# Patient Record
Sex: Male | Born: 1975 | Race: Black or African American | Hispanic: No | Marital: Married | Smoking: Former smoker
Health system: Southern US, Community
[De-identification: ages and names within clinical notes are randomized; demographics above are authoritative.]

## PROBLEM LIST (undated history)

## (undated) DIAGNOSIS — R569 Unspecified convulsions: Secondary | ICD-10-CM

---

## 1998-11-04 ENCOUNTER — Emergency Department (HOSPITAL_COMMUNITY): Admission: EM | Admit: 1998-11-04 | Discharge: 1998-11-04 | Payer: Self-pay | Admitting: Emergency Medicine

## 1998-11-22 ENCOUNTER — Emergency Department (HOSPITAL_COMMUNITY): Admission: EM | Admit: 1998-11-22 | Discharge: 1998-11-22 | Payer: Self-pay

## 1998-12-16 ENCOUNTER — Emergency Department (HOSPITAL_COMMUNITY): Admission: EM | Admit: 1998-12-16 | Discharge: 1998-12-16 | Payer: Self-pay | Admitting: Emergency Medicine

## 1999-05-09 ENCOUNTER — Emergency Department (HOSPITAL_COMMUNITY): Admission: EM | Admit: 1999-05-09 | Discharge: 1999-05-09 | Payer: Self-pay | Admitting: *Deleted

## 2007-08-11 ENCOUNTER — Emergency Department (HOSPITAL_COMMUNITY): Admission: EM | Admit: 2007-08-11 | Discharge: 2007-08-11 | Payer: Self-pay | Admitting: Emergency Medicine

## 2007-09-17 ENCOUNTER — Emergency Department (HOSPITAL_COMMUNITY): Admission: EM | Admit: 2007-09-17 | Discharge: 2007-09-17 | Payer: Self-pay | Admitting: Family Medicine

## 2007-10-20 ENCOUNTER — Emergency Department (HOSPITAL_COMMUNITY): Admission: EM | Admit: 2007-10-20 | Discharge: 2007-10-20 | Payer: Self-pay | Admitting: Emergency Medicine

## 2008-09-09 ENCOUNTER — Emergency Department (HOSPITAL_COMMUNITY): Admission: EM | Admit: 2008-09-09 | Discharge: 2008-09-09 | Payer: Self-pay | Admitting: Family Medicine

## 2008-09-23 ENCOUNTER — Emergency Department (HOSPITAL_COMMUNITY): Admission: EM | Admit: 2008-09-23 | Discharge: 2008-09-23 | Payer: Self-pay | Admitting: Emergency Medicine

## 2008-10-12 ENCOUNTER — Emergency Department (HOSPITAL_COMMUNITY): Admission: EM | Admit: 2008-10-12 | Discharge: 2008-10-13 | Payer: Self-pay | Admitting: Emergency Medicine

## 2008-12-10 ENCOUNTER — Ambulatory Visit (HOSPITAL_COMMUNITY): Admission: EM | Admit: 2008-12-10 | Discharge: 2008-12-10 | Payer: Self-pay | Admitting: Emergency Medicine

## 2009-01-29 ENCOUNTER — Emergency Department (HOSPITAL_COMMUNITY): Admission: EM | Admit: 2009-01-29 | Discharge: 2009-01-29 | Payer: Self-pay | Admitting: Emergency Medicine

## 2009-12-01 ENCOUNTER — Emergency Department (HOSPITAL_COMMUNITY): Admission: EM | Admit: 2009-12-01 | Discharge: 2009-12-02 | Payer: Self-pay | Admitting: Emergency Medicine

## 2010-03-09 ENCOUNTER — Emergency Department (HOSPITAL_COMMUNITY): Admission: EM | Admit: 2010-03-09 | Discharge: 2010-03-09 | Payer: Self-pay | Admitting: Emergency Medicine

## 2010-07-24 ENCOUNTER — Emergency Department (HOSPITAL_COMMUNITY): Admission: EM | Admit: 2010-07-24 | Discharge: 2010-07-24 | Payer: Self-pay | Admitting: Emergency Medicine

## 2010-09-08 ENCOUNTER — Emergency Department (HOSPITAL_COMMUNITY): Admission: EM | Admit: 2010-09-08 | Discharge: 2010-09-09 | Payer: Self-pay | Admitting: Emergency Medicine

## 2010-10-25 ENCOUNTER — Emergency Department (HOSPITAL_COMMUNITY)
Admission: EM | Admit: 2010-10-25 | Discharge: 2010-10-26 | Payer: Self-pay | Source: Home / Self Care | Admitting: Emergency Medicine

## 2010-12-13 ENCOUNTER — Emergency Department (HOSPITAL_COMMUNITY)
Admission: EM | Admit: 2010-12-13 | Discharge: 2010-12-13 | Payer: Self-pay | Source: Home / Self Care | Admitting: Emergency Medicine

## 2011-02-28 LAB — CARBAMAZEPINE LEVEL, TOTAL: Carbamazepine Lvl: 3.4 ug/mL — ABNORMAL LOW (ref 4.0–12.0)

## 2011-03-01 LAB — URINE MICROSCOPIC-ADD ON

## 2011-03-01 LAB — RAPID URINE DRUG SCREEN, HOSP PERFORMED
Amphetamines: NOT DETECTED
Opiates: NOT DETECTED
Tetrahydrocannabinol: POSITIVE — AB

## 2011-03-01 LAB — URINALYSIS, ROUTINE W REFLEX MICROSCOPIC
Leukocytes, UA: NEGATIVE
Nitrite: NEGATIVE
Protein, ur: 100 mg/dL — AB
Specific Gravity, Urine: 1.015 (ref 1.005–1.030)
Urobilinogen, UA: 0.2 mg/dL (ref 0.0–1.0)

## 2011-03-01 LAB — BLOOD GAS, ARTERIAL
Drawn by: 331761
FIO2: 0.32 %
pCO2 arterial: 47.8 mmHg — ABNORMAL HIGH (ref 35.0–45.0)
pH, Arterial: 7.359 (ref 7.350–7.450)

## 2011-03-01 LAB — DIFFERENTIAL
Lymphs Abs: 4.1 10*3/uL — ABNORMAL HIGH (ref 0.7–4.0)
Monocytes Relative: 9 % (ref 3–12)
Neutro Abs: 4.6 10*3/uL (ref 1.7–7.7)
Neutrophils Relative %: 47 % (ref 43–77)

## 2011-03-01 LAB — CBC
HCT: 45.3 % (ref 39.0–52.0)
MCHC: 32.8 g/dL (ref 30.0–36.0)
Platelets: 242 10*3/uL (ref 150–400)
RDW: 14.8 % (ref 11.5–15.5)

## 2011-03-01 LAB — COMPREHENSIVE METABOLIC PANEL
ALT: 21 U/L (ref 0–53)
Albumin: 4.4 g/dL (ref 3.5–5.2)
BUN: 10 mg/dL (ref 6–23)
Calcium: 10.1 mg/dL (ref 8.4–10.5)
Glucose, Bld: 134 mg/dL — ABNORMAL HIGH (ref 70–99)
Sodium: 144 mEq/L (ref 135–145)
Total Protein: 7.8 g/dL (ref 6.0–8.3)

## 2011-03-01 LAB — GLUCOSE, CAPILLARY: Glucose-Capillary: 134 mg/dL — ABNORMAL HIGH (ref 70–99)

## 2011-03-01 LAB — ETHANOL: Alcohol, Ethyl (B): <5 mg/dL (ref 0–10)

## 2011-03-01 LAB — LACTIC ACID, PLASMA: Lactic Acid, Venous: 2.1 mmol/L (ref 0.5–2.2)

## 2011-03-03 LAB — POCT I-STAT, CHEM 8
Calcium, Ion: 1.17 mmol/L (ref 1.12–1.32)
Chloride: 103 mEq/L (ref 96–112)
Glucose, Bld: 79 mg/dL (ref 70–99)
HCT: 40 % (ref 39.0–52.0)
Hemoglobin: 13.6 g/dL (ref 13.0–17.0)

## 2011-03-03 LAB — GLUCOSE, CAPILLARY: Glucose-Capillary: 79 mg/dL (ref 70–99)

## 2011-03-04 LAB — BASIC METABOLIC PANEL
BUN: 14 mg/dL (ref 6–23)
GFR calc non Af Amer: >60 mL/min (ref >60)
Glucose, Bld: 89 mg/dL (ref 70–99)
Potassium: 4.8 mEq/L (ref 3.5–5.1)

## 2011-03-14 LAB — BASIC METABOLIC PANEL
BUN: 12 mg/dL (ref 6–23)
GFR calc non Af Amer: >60 mL/min (ref >60)
Glucose, Bld: 127 mg/dL — ABNORMAL HIGH (ref 70–99)
Potassium: 4.4 mEq/L (ref 3.5–5.1)

## 2011-04-05 LAB — DIFFERENTIAL
Basophils Absolute: 0 10*3/uL (ref 0.0–0.1)
Eosinophils Absolute: 0.1 10*3/uL (ref 0.0–0.7)
Eosinophils Relative: 1 % (ref 0–5)
Lymphocytes Relative: 23 % (ref 12–46)
Lymphs Abs: 3.2 10*3/uL (ref 0.7–4.0)
Monocytes Absolute: 0.7 10*3/uL (ref 0.1–1.0)

## 2011-04-05 LAB — CARBAMAZEPINE LEVEL, TOTAL: Carbamazepine Lvl: 4.6 ug/mL (ref 4.0–12.0)

## 2011-04-05 LAB — RAPID URINE DRUG SCREEN, HOSP PERFORMED
Amphetamines: NOT DETECTED
Benzodiazepines: POSITIVE — AB

## 2011-04-05 LAB — CBC
HCT: 46.4 % (ref 39.0–52.0)
Hemoglobin: 15.1 g/dL (ref 13.0–17.0)
MCV: 92.5 fL (ref 78.0–100.0)
Platelets: 267 10*3/uL (ref 150–400)
RDW: 15.4 % (ref 11.5–15.5)

## 2011-04-05 LAB — BASIC METABOLIC PANEL
BUN: 9 mg/dL (ref 6–23)
Chloride: 104 mEq/L (ref 96–112)
GFR calc non Af Amer: >60 mL/min (ref >60)
Glucose, Bld: 164 mg/dL — ABNORMAL HIGH (ref 70–99)
Potassium: 4.4 mEq/L (ref 3.5–5.1)
Sodium: 139 mEq/L (ref 135–145)

## 2011-05-03 NOTE — Op Note (Signed)
Mark Trevino, Mark Trevino          ACCOUNT NO.:  [id number]   MEDICAL RECORD NO.:  [id number]          PATIENT TYPE:  OBV   LOCATION:  2550                         FACILITY:  [facility]   PHYSICIAN:  Kinnie Scales. Annalee Genta, M.D.DATE OF BIRTH:  [birth year]   DATE OF PROCEDURE:  DATE OF DISCHARGE:  12/10/2008                               OPERATIVE REPORT   LOCATION:  [facility], main OR.   PREOPERATIVE DIAGNOSES:  1. Esophageal foreign body.  2. Seizure disorder.   POSTOPERATIVE DIAGNOSES:  1. Esophageal foreign body.  2. Seizure disorder.   SURGICAL PROCEDURES:  Direct laryngoscopy and esophagoscopy with removal  of esophageal foreign body under gentle anesthesia.   SURGEON:  Kinnie Scales. Annalee Genta, MD   ANESTHESIA:  General endotracheal.   COMPLICATIONS:  None.   BLOOD LOSS:  Minimal.  The patient transferred from the operating room  to recovery in stable condition.   FINDINGS:  The patient had a large foreign body in the esophageal  introitus, this was his partial denture prosthesis.  On removal of the  prosthesis, there was a small ulcerated area on the posterior aspect of  the esophageal introitus.  No evidence of perforation or infection.   BRIEF HISTORY:  The patient is a 35 year old black male with history of  seizure disorder.  The patient has been taking his medications.  He  suffered a seizure on the morning of admission and was brought to the  [facility] Emergency Department.  In the ER, the patient  complained of severe sore throat and difficulty swallowing fluids,  solids, and his own secretions.  Flexible endoscopy performed in the  emergency room showed purulent secretions in the posterior hypopharynx  and possible esophageal foreign body.  Given his history and physical  findings, I recommend he undertake direct laryngoscopy, esophagoscopy,  and removal of esophageal foreign body.  The risks, benefits, and  possible complications were discussed with  the patient who understood  and concurred plan for surgery.   PROCEDURE:  The patient was brought to the operating room on December 09, 2008, to the [facility] Main OR on an emergency basis for  evaluation of an esophageal foreign body.  The patient was placed under  general anesthesia and intubated.  Direct laryngoscopy was performed.  Small amount of bloody secretions which were suctioned.  The patient was  found that the patient's foreign body had migrated proximally and was  removed from the hypopharynx without difficulty.  The remainder of the  hypopharynx was examined, no evidence of lesion or ulcer, cervical  esophagoscopy was then performed.  The cervical esophagoscope was  inserted full-length to 20 cm and then gradually withdrawn.  There was  no evidence of additional foreign body or abnormality with the exception  of small ulcerated area on the posterior aspect of the esophageal  introitus.  No evidence of perforation, bleeding, or infection.  The  patient's oral cavity was irrigated and suctioned.  He was then awakened  from his anesthetic, extubated, and transferred from the operating room  to recovery room in stable condition.  No complications.  Blood loss,  minimal.           ______________________________  Kinnie Scales. Annalee Genta, M.D.     DLS/MEDQ  D:  16/09/9603  T:  12/11/2008  Job:  [id number]

## 2011-09-20 LAB — URINALYSIS, ROUTINE W REFLEX MICROSCOPIC
Glucose, UA: NEGATIVE
Leukocytes, UA: NEGATIVE
Protein, ur: 100 — AB
Specific Gravity, Urine: 1.028
pH: 5.5

## 2011-09-20 LAB — BASIC METABOLIC PANEL
Calcium: 9.2
Creatinine, Ser: 1.06
GFR calc Af Amer: >60
Sodium: 135

## 2011-09-20 LAB — URINE MICROSCOPIC-ADD ON

## 2011-09-20 LAB — DIFFERENTIAL
Lymphocytes Relative: 12
Lymphs Abs: 1
Monocytes Relative: 4
Neutro Abs: 6.9
Neutrophils Relative %: 84 — ABNORMAL HIGH

## 2011-09-20 LAB — CBC
Hemoglobin: 13.9
RBC: 4.64
WBC: 8.2

## 2011-09-23 LAB — RAPID STREP SCREEN (MED CTR MEBANE ONLY): Streptococcus, Group A Screen (Direct): NEGATIVE

## 2019-11-11 ENCOUNTER — Emergency Department (HOSPITAL_COMMUNITY)
Admission: EM | Admit: 2019-11-11 | Discharge: 2019-11-12 | Disposition: A | Discharge status: Home / Self Care | Payer: HRSA Program | Attending: Emergency Medicine | Admitting: Emergency Medicine

## 2019-11-11 ENCOUNTER — Emergency Department (HOSPITAL_COMMUNITY): Payer: HRSA Program

## 2019-11-11 ENCOUNTER — Other Ambulatory Visit: Payer: Self-pay

## 2019-11-11 ENCOUNTER — Encounter (HOSPITAL_COMMUNITY): Payer: Self-pay | Admitting: *Deleted

## 2019-11-11 DIAGNOSIS — U071 COVID-19: Secondary | ICD-10-CM | POA: Diagnosis not present

## 2019-11-11 DIAGNOSIS — R0602 Shortness of breath: Secondary | ICD-10-CM | POA: Diagnosis present

## 2019-11-11 HISTORY — DX: Unspecified convulsions: R56.9

## 2019-11-11 LAB — I-STAT CHEM 8, ED
BUN: 12 mg/dL (ref 6–20)
Calcium, Ion: 1 mmol/L — ABNORMAL LOW (ref 1.15–1.40)
Chloride: 92 mmol/L — ABNORMAL LOW (ref 98–111)
Creatinine, Ser: 1.1 mg/dL (ref 0.61–1.24)
Glucose, Bld: 112 mg/dL — ABNORMAL HIGH (ref 70–99)
HCT: 40 % (ref 39.0–52.0)
Hemoglobin: 13.6 g/dL (ref 13.0–17.0)
Potassium: 3.9 mmol/L (ref 3.5–5.1)
Sodium: 129 mmol/L — ABNORMAL LOW (ref 135–145)
TCO2: 30 mmol/L (ref 22–32)

## 2019-11-11 MED ORDER — LACTATED RINGERS IV BOLUS
1000.0000 mL | Freq: Once | INTRAVENOUS | Status: AC
  Administered 2019-11-11: 1000 mL via INTRAVENOUS

## 2019-11-11 MED ORDER — ALBUTEROL SULFATE HFA 108 (90 BASE) MCG/ACT IN AERS
6.0000 | INHALATION_SPRAY | Freq: Once | RESPIRATORY_TRACT | Status: AC
  Administered 2019-11-11: 6 via RESPIRATORY_TRACT
  Filled 2019-11-11: qty 6.7

## 2019-11-11 MED ORDER — ACETAMINOPHEN 325 MG PO TABS: 650.0000 mg | ORAL_TABLET | Freq: Once | ORAL | Status: DC | PRN

## 2019-11-11 MED ORDER — KETOROLAC TROMETHAMINE 30 MG/ML IJ SOLN
30.0000 mg | Freq: Once | INTRAMUSCULAR | Status: AC
  Administered 2019-11-11: 30 mg via INTRAVENOUS
  Filled 2019-11-11: qty 1

## 2019-11-11 NOTE — ED Triage Notes (Signed)
Pt says that he tested positive for COVID on Saturday. Said he has had cough, SOB, sweaty, fatigue. tmax 102 Sunday. No acute distress.

## 2019-11-11 NOTE — ED Provider Notes (Signed)
[facility] EMERGENCY DEPARTMENT Provider Note   CSN: [id number] Arrival date & time: 11/11/19  2100     History   Chief Complaint No chief complaint on file.   HPI KHARTER BREW is a 43 y.o. male.     Diagnosed with covid a few days ago and has arthralgias, myalgias, fever, cough, sore throat, headache decreased p.o. intake for solids, decreased appetite and loss of taste.  No significant breathing difficulties. Has taken aleve at home which helps some but fades quickly.      Past Medical History:  Diagnosis Date  . Seizures (HCC)     There are no active problems to display for this patient.   History reviewed. No pertinent surgical history.      Home Medications    Prior to Admission medications   Not on File    Family History No family history on file.  Social History Social History   Tobacco Use  . Smoking status: Never Smoker  Substance Use Topics  . Alcohol use: Not Currently    Frequency: Never  . Drug use: Not Currently     Allergies   Patient has no known allergies.   Review of Systems Review of Systems  All other systems reviewed and are negative.    Physical Exam Updated Vital Signs BP 119/74   Pulse 92   Temp (!) 102.6 F (39.2 C) (Oral)   Resp (!) 21   SpO2 94%   Physical Exam Vitals signs and nursing note reviewed.  Constitutional:      Appearance: He is well-developed.  HENT:     Head: Normocephalic and atraumatic.     Mouth/Throat:     Mouth: Mucous membranes are dry.     Pharynx: Oropharynx is clear.  Neck:     Musculoskeletal: Normal range of motion.  Cardiovascular:     Rate and Rhythm: Normal rate.  Pulmonary:     Effort: Pulmonary effort is normal. No respiratory distress.  Abdominal:     General: Abdomen is flat. There is no distension.  Musculoskeletal: Normal range of motion.  Skin:    General: Skin is warm and dry.     Coloration: Skin is not jaundiced.  Neurological:     General: No focal deficit present.     Mental Status: He is alert.      ED Treatments / Results  Labs (all labs ordered are listed, but only abnormal results are displayed) Labs Reviewed  I-STAT CHEM 8, ED - Abnormal; Notable for the following components:      Result Value   Sodium 129 (*)    Chloride 92 (*)    Glucose, Bld 112 (*)    Calcium, Ion 1.00 (*)    All other components within normal limits    EKG None  Radiology Dg Chest Portable 1 View  Result Date: 11/11/2019 CLINICAL DATA:  Cough and short of breath, COVID positive EXAM: PORTABLE CHEST 1 VIEW COMPARISON:  01/29/2009 FINDINGS: Streaky perihilar opacity with patchy airspace disease at the bases. Normal heart size. No pneumothorax. Possible metallic foreign body over the left mid chest. IMPRESSION: Streaky perihilar opacity with patchy airspace disease the bases, consistent with pneumonia, and given clinical history. Electronically Signed   By: Jasmine Pang M.D.   On: 11/11/2019 22:58    Procedures Procedures (including critical care time)  Medications Ordered in ED Medications  acetaminophen (TYLENOL) tablet 650 mg (has no administration in time range)  lactated ringers bolus  1,000 mL (1,000 mLs Intravenous New Bag/Given 11/11/19 2222)  albuterol (VENTOLIN HFA) 108 (90 Base) MCG/ACT inhaler 6 puff (6 puffs Inhalation Given 11/11/19 2222)  ketorolac (TORADOL) 30 MG/ML injection 30 mg (30 mg Intravenous Given 11/11/19 2223)     Initial Impression / Assessment and Plan / ED Course  I have reviewed the triage vital signs and the nursing notes.  Pertinent labs & imaging results that were available during my care of the patient were reviewed by me and considered in my medical decision making (see chart for details).  Overall patient appears well.  He is not tachypneic, hypoxic, tachycardic no evidence of respiratory distress.  Patient is tolerating p.o. without difficulty.  And is work-up is unremarkable.   Discussed reasons for return to the emergency department but this time I do feel patient needs admission or further work-up however he is only on day 3 of illness so is likely to still get worse.  Final Clinical Impressions(s) / ED Diagnoses   Final diagnoses:  [id number]    ED Discharge Orders    None       Lamarco Gudiel, Corene Cornea, MD 11/11/19 2329

## 2019-11-12 NOTE — ED Notes (Signed)
Discharge instructions reviewed with pt. Pt verbalized understanding.   

## 2019-11-21 ENCOUNTER — Encounter: Payer: Self-pay | Admitting: Family Medicine

## 2019-11-21 ENCOUNTER — Telehealth (INDEPENDENT_AMBULATORY_CARE_PROVIDER_SITE_OTHER): Payer: No Typology Code available for payment source | Admitting: Family Medicine

## 2019-11-21 DIAGNOSIS — Z0000 Encounter for general adult medical examination without abnormal findings: Secondary | ICD-10-CM | POA: Diagnosis not present

## 2019-11-21 DIAGNOSIS — Z5181 Encounter for therapeutic drug level monitoring: Secondary | ICD-10-CM

## 2019-11-21 DIAGNOSIS — G40409 Other generalized epilepsy and epileptic syndromes, not intractable, without status epilepticus: Secondary | ICD-10-CM

## 2019-11-21 MED ORDER — CARBAMAZEPINE 200 MG PO TABS: ORAL_TABLET | ORAL | 0 refills | Status: DC

## 2019-11-21 MED ORDER — LAMOTRIGINE 200 MG PO TABS: 200.0000 mg | ORAL_TABLET | Freq: Two times a day (BID) | ORAL | 0 refills | Status: DC

## 2019-11-21 NOTE — Progress Notes (Signed)
Established Patient Office Visit  Subjective:  Patient ID: Mark Trevino, male    DOB: 1976-11-26  Age: 43 y.o. MRN: [id number]  CC:  Chief Complaint  Patient presents with  . Establish Care    HPI Mark Trevino presents for establishment of care and follow-up for his seizure disorder.  Carries a diagnosis of grand mal seizure disorder since age 38.  Historically well controlled with Tegretol and Lamictal.  Moved into this area back in June.  Is neurologist from [place] called in the emergency supply and instructed him to find a physician.  Patient has not had follow-up with a neurologist in quite some time.  He did experience a seizure back in June when he was given prednisone for a work-related injury.  He is here in [place] with his wife and 64 and 69 year old daughters.  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  He is on light duty from a work-related arm injury.  He does not smoke drink alcohol or use illicit drugs.  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  Parents are deceased.  Mom had diabetes.  Dad had dementia and cancer.  Patient believes his dad had prostate cancer.  Patient developed symptoms of Covid back on the 20th.  Seen at [facility] on the 23rd.  Is recovering from that illness.  His Covid test was negative he tells me.  Past Medical History:  Diagnosis Date  . Seizures ([place])     History reviewed. No pertinent surgical history.  History reviewed. No pertinent family history.  Social History   Socioeconomic History  . Marital status: Married    Spouse name: Not on file  . Number of children: Not on file  . Years of education: Not on file  . Highest education level: Not on file  Occupational History  . Not on file  Social Needs  . Financial resource strain: Not on file  . Food insecurity    Worry: Not on file    Inability: Not on file  . Transportation needs    Medical: Not on file    Non-medical: Not  on file  Tobacco Use  . Smoking status: Never Smoker  . Smokeless tobacco: Never Used  Substance and Sexual Activity  . Alcohol use: Not Currently    Frequency: Never  . Drug use: Not Currently  . Sexual activity: Not on file  Lifestyle  . Physical activity    Days per week: Not on file    Minutes per session: Not on file  . Stress: Not on file  Relationships  . Social Herbalist on phone: Not on file    Gets together: Not on file    Attends religious service: Not on file    Active member of club or organization: Not on file    Attends meetings of clubs or organizations: Not on file    Relationship status: Not on file  . Intimate partner violence    Fear of current or ex partner: Not on file    Emotionally abused: Not on file    Physically abused: Not on file    Forced sexual activity: Not on file  Other Topics Concern  . Not on file  Social History Narrative  . Not on file    Outpatient Medications Prior to Visit  Medication Sig Dispense Refill  . carbamazepine (TEGRETOL) 200 MG tablet Take 200 mg by mouth 3 (three) times daily.    Marland Kitchen  carbamazepine (TEGRETOL) 200 MG tablet Take 400 mg by mouth 3 (three) times daily.    Marland Kitchen lamoTRIgine (LAMICTAL) 200 MG tablet Take 200 mg by mouth 2 (two) times daily.     No facility-administered medications prior to visit.     No Known Allergies  ROS Review of Systems  Constitutional: Negative for diaphoresis, fatigue, fever and unexpected weight change.  HENT: Negative.   Eyes: Negative for photophobia and visual disturbance.  Respiratory: Negative.   Cardiovascular: Negative.   Gastrointestinal: Negative.   Endocrine: Negative for polyphagia and polyuria.  Genitourinary: Negative.   Musculoskeletal: Negative.   Allergic/Immunologic: Negative for immunocompromised state.  Neurological: Positive for seizures. Negative for speech difficulty.  Hematological: Negative.   Psychiatric/Behavioral: Negative.        Objective:    Physical Exam  Constitutional: He is oriented to person, place, and time. He appears well-developed and well-nourished. No distress.  HENT:  Head: Normocephalic and atraumatic.  Right Ear: External ear normal.  Left Ear: External ear normal.  Eyes: Conjunctivae are normal. Right eye exhibits no discharge. Left eye exhibits no discharge. No scleral icterus.  Neck: No JVD present. No tracheal deviation present.  Pulmonary/Chest: Effort normal. No stridor.  Neurological: He is alert and oriented to person, place, and time.  Skin: He is not diaphoretic.  Psychiatric: He has a normal mood and affect. His behavior is normal.    There were no vitals taken for this visit. Wt Readings from Last 3 Encounters:  No data found for Wt   BP Readings from Last 3 Encounters:  11/12/19 113/76   Guideline developer:  UpToDate (see UpToDate for funding source) Date Released: June 2014  Health Maintenance Due  Topic Date Due  . HIV Screening  04/13/1991  . TETANUS/TDAP  04/13/1995  . INFLUENZA VACCINE  07/20/2019    There are no preventive care reminders to display for this patient.  No results found for: TSH Lab Results  Component Value Date   WBC 9.7 10/25/2010   HGB 13.6 11/11/2019   HCT 40.0 11/11/2019   MCV 94.3 10/25/2010   PLT 242 10/25/2010   Lab Results  Component Value Date   NA 129 (L) 11/11/2019   K 3.9 11/11/2019   CO2 (LL) 10/25/2010    8 RESULT REPEATED AND VERIFIED CRITICAL RESULT CALLED TO, READ BACK BY AND VERIFIED WITH: HENDERSON,C. AT 1949 ON [id number] BY LOVE,T.   GLUCOSE 112 (H) 11/11/2019   BUN 12 11/11/2019   CREATININE 1.10 11/11/2019   BILITOT 0.3 10/25/2010   ALKPHOS 74 10/25/2010   AST 40 (H) 10/25/2010   ALT 21 10/25/2010   PROT 7.8 10/25/2010   ALBUMIN 4.4 10/25/2010   CALCIUM 10.1 10/25/2010   No results found for: CHOL No results found for: HDL No results found for: LDLCALC No results found for: TRIG No results found for: CHOLHDL  No results found for: HGBA1C    Assessment & Plan:   Problem List Items Addressed This Visit      Nervous and Auditory   Grand mal seizure disorder ([place])   Relevant Medications   carbamazepine (TEGRETOL) 200 MG tablet   lamoTRIgine (LAMICTAL) 200 MG tablet   Other Relevant Orders   Ambulatory referral to Neurology     Other   Healthcare maintenance - Primary   Relevant Orders   CBC   Comp Met (CMET)   Lipid Profile   Urinalysis, Routine w reflex microscopic   TSH   HIV antibody (with reflex)  Flu Vaccine QUAD 6+ mos PF IM (Fluarix Quad PF)   PSA    Other Visit Diagnoses    Medication monitoring encounter       Relevant Orders   CBC   Comp Met (CMET)   Carbamazepine Level (Tegretol), total   Lamotrigine level      Meds ordered this encounter  Medications  . carbamazepine (TEGRETOL) 200 MG tablet    Sig: Take 3 tablets twice daily.    Dispense:  540 tablet    Refill:  0  . lamoTRIgine (LAMICTAL) 200 MG tablet    Sig: Take 1 tablet (200 mg total) by mouth 2 (two) times daily.    Dispense:  180 tablet    Refill:  0    Follow-up: Return come to clinic fasting in one week for ordered blood work and to see me for physical within 3 months.  Patient assures me that he will go for his to be scheduled appointment with a neurologist.  He understands that we will be following his seizure disorder at this clinic.  Virtual Visit via Video Note  I connected with Mark Trevino on 11/21/19 at  2:30 PM EST by a video enabled telemedicine application and verified that I am speaking with the correct person using two identifiers.  Location: Patient: home with wife. Provider:    I discussed the limitations of evaluation and management by telemedicine and the availability of in person appointments. The patient expressed understanding and agreed to proceed.  History of Present Illness:    Observations/Objective:   Assessment and Plan:   Follow Up Instructions:     I discussed the assessment and treatment plan with the patient. The patient was provided an opportunity to ask questions and all were answered. The patient agreed with the plan and demonstrated an understanding of the instructions.   The patient was advised to call back or seek an in-person evaluation if the symptoms worsen or if the condition fails to improve as anticipated.  I provided 30 minutes of non-face-to-face time during this encounter.   Libby Maw, MD

## 2019-11-26 ENCOUNTER — Encounter: Payer: Self-pay | Admitting: Neurology

## 2019-12-17 ENCOUNTER — Other Ambulatory Visit: Payer: Self-pay | Admitting: Family Medicine

## 2019-12-17 DIAGNOSIS — Z0000 Encounter for general adult medical examination without abnormal findings: Secondary | ICD-10-CM

## 2019-12-18 ENCOUNTER — Other Ambulatory Visit: Payer: Self-pay | Admitting: Family Medicine

## 2019-12-18 ENCOUNTER — Other Ambulatory Visit: Payer: Self-pay

## 2019-12-18 ENCOUNTER — Other Ambulatory Visit (INDEPENDENT_AMBULATORY_CARE_PROVIDER_SITE_OTHER): Payer: No Typology Code available for payment source

## 2019-12-18 DIAGNOSIS — G40409 Other generalized epilepsy and epileptic syndromes, not intractable, without status epilepticus: Secondary | ICD-10-CM

## 2019-12-18 DIAGNOSIS — Z0000 Encounter for general adult medical examination without abnormal findings: Secondary | ICD-10-CM

## 2019-12-18 DIAGNOSIS — Z5181 Encounter for therapeutic drug level monitoring: Secondary | ICD-10-CM | POA: Diagnosis not present

## 2019-12-18 LAB — URINALYSIS, ROUTINE W REFLEX MICROSCOPIC
Bilirubin Urine: NEGATIVE
Hgb urine dipstick: NEGATIVE
Ketones, ur: NEGATIVE
Leukocytes,Ua: NEGATIVE
Nitrite: NEGATIVE
RBC / HPF: NONE SEEN (ref 0–?)
Specific Gravity, Urine: >=1.03 — AB (ref 1.000–1.030)
Total Protein, Urine: NEGATIVE
Urine Glucose: NEGATIVE
Urobilinogen, UA: 0.2 (ref 0.0–1.0)
pH: 5.5 (ref 5.0–8.0)

## 2019-12-18 LAB — LIPID PANEL
Cholesterol: 267 mg/dL — ABNORMAL HIGH (ref 0–200)
HDL: 58 mg/dL (ref >39.00)
LDL Cholesterol: 189 mg/dL — ABNORMAL HIGH (ref 0–99)
NonHDL: 209.04
Total CHOL/HDL Ratio: 5
Triglycerides: 99 mg/dL (ref 0.0–149.0)
VLDL: 19.8 mg/dL (ref 0.0–40.0)

## 2019-12-18 LAB — COMPREHENSIVE METABOLIC PANEL
ALT: 15 U/L (ref 0–53)
AST: 17 U/L (ref 0–37)
Albumin: 4.6 g/dL (ref 3.5–5.2)
Alkaline Phosphatase: 56 U/L (ref 39–117)
BUN: 11 mg/dL (ref 6–23)
CO2: 31 mEq/L (ref 19–32)
Calcium: 9.5 mg/dL (ref 8.4–10.5)
Chloride: 102 mEq/L (ref 96–112)
Creatinine, Ser: 0.98 mg/dL (ref 0.40–1.50)
GFR: 100.69 mL/min (ref >60.00)
Glucose, Bld: 109 mg/dL — ABNORMAL HIGH (ref 70–99)
Potassium: 4.3 mEq/L (ref 3.5–5.1)
Sodium: 140 mEq/L (ref 135–145)
Total Bilirubin: 0.4 mg/dL (ref 0.2–1.2)
Total Protein: 7.3 g/dL (ref 6.0–8.3)

## 2019-12-18 LAB — CBC
HCT: 39.5 % (ref 39.0–52.0)
Hemoglobin: 12.9 g/dL — ABNORMAL LOW (ref 13.0–17.0)
MCHC: 32.7 g/dL (ref 30.0–36.0)
MCV: 87.9 fl (ref 78.0–100.0)
Platelets: 216 10*3/uL (ref 150.0–400.0)
RBC: 4.5 Mil/uL (ref 4.22–5.81)
RDW: 16.4 % — ABNORMAL HIGH (ref 11.5–15.5)
WBC: 2.9 10*3/uL — ABNORMAL LOW (ref 4.0–10.5)

## 2019-12-18 LAB — TSH: TSH: 1.6 u[IU]/mL (ref 0.35–4.50)

## 2019-12-18 LAB — PSA: PSA: 0.38 ng/mL (ref 0.10–4.00)

## 2019-12-19 ENCOUNTER — Encounter: Payer: Self-pay | Admitting: Family Medicine

## 2019-12-21 LAB — CARBAMAZEPINE LEVEL, TOTAL: Carbamazepine Lvl: 11 mg/L (ref 4.0–12.0)

## 2019-12-21 LAB — LAMOTRIGINE LEVEL: Lamotrigine Lvl: 5.1 ug/mL (ref 4.0–18.0)

## 2019-12-21 LAB — HIV ANTIBODY (ROUTINE TESTING W REFLEX): HIV 1&2 Ab, 4th Generation: NONREACTIVE

## 2020-01-19 ENCOUNTER — Other Ambulatory Visit: Payer: Self-pay | Admitting: Family Medicine

## 2020-01-19 DIAGNOSIS — G40409 Other generalized epilepsy and epileptic syndromes, not intractable, without status epilepticus: Secondary | ICD-10-CM

## 2020-01-20 NOTE — Telephone Encounter (Signed)
Pt has an upcoming appt with Neuro

## 2020-01-29 ENCOUNTER — Other Ambulatory Visit: Payer: Self-pay

## 2020-01-29 ENCOUNTER — Encounter: Payer: Self-pay | Admitting: Neurology

## 2020-01-29 ENCOUNTER — Ambulatory Visit (INDEPENDENT_AMBULATORY_CARE_PROVIDER_SITE_OTHER): Payer: No Typology Code available for payment source | Admitting: Neurology

## 2020-01-29 VITALS — BP 122/78 | HR 75 | Ht 77.0 in | Wt 245.0 lb

## 2020-01-29 DIAGNOSIS — G40009 Localization-related (focal) (partial) idiopathic epilepsy and epileptic syndromes with seizures of localized onset, not intractable, without status epilepticus: Secondary | ICD-10-CM | POA: Diagnosis not present

## 2020-01-29 MED ORDER — CARBAMAZEPINE 200 MG PO TABS: ORAL_TABLET | ORAL | 3 refills | Status: AC

## 2020-01-29 MED ORDER — LAMOTRIGINE 200 MG PO TABS: 200.0000 mg | ORAL_TABLET | Freq: Two times a day (BID) | ORAL | 3 refills | Status: AC

## 2020-01-29 NOTE — Patient Instructions (Addendum)
1. Schedule 1-hour EEG  2. Continue Tegretol 200mg  take 3 tabs twice a day and Lamictal 200mg  1 tab twice a day  3. Follow-up in 6 months, call for any changes  Seizure Precautions: 1. If medication has been prescribed for you to prevent seizures, take it exactly as directed.  Do not stop taking the medicine without talking to your doctor first, even if you have not had a seizure in a long time.   2. Avoid activities in which a seizure would cause danger to yourself or to others.  Don't operate dangerous machinery, swim alone, or climb in high or dangerous places, such as on ladders, roofs, or girders.  Do not drive unless your doctor says you may.  3. If you have any warning that you may have a seizure, lay down in a safe place where you can't hurt yourself.    4.  No driving for 6 months from last seizure, as per [facility].   Please refer to the following link on the [employer]ile[employer] of [employer]'s website for more information: http://www.epilepsyfoundation.org/answerplace/Social/driving/drivingu.cfm   5.  Maintain good sleep hygiene.  6.  Contact your doctor if you have any problems that may be related to the medicine you are taking.  7.  Call 911 and bring the patient back to the ED if:        A.  The seizure lasts longer than 5 minutes.       B.  The patient doesn't awaken shortly after the seizure  C.  The patient has new problems such as difficulty seeing, speaking or moving  D.  The patient was injured during the seizure  E.  The patient has a temperature over 102 F (39C)  F.  The patient vomited and now is having trouble breathing

## 2020-01-29 NOTE — Progress Notes (Signed)
NEUROLOGY CONSULTATION NOTE  Mark Trevino MRN: [id number] DOB: [birth year]  Referring provider: Dr. Abelino Derrick Primary care provider: Dr. Abelino Derrick  Reason for consult:  seizures  Dear Dr Ethelene Hal:  Thank you for your kind referral of Mark Trevino for consultation of the above symptoms. Although his history is well known to you, please allow me to reiterate it for the purpose of our medical record. He is alone in the office today. Records and images were personally reviewed where available.   HISTORY OF PRESENT ILLNESS: This is a 44 year old right-handed man with a history of seizures since age 22 presenting to establish care. He reports generalized convulsions that are preceded by a feeling in his stomach coming up his neck. His wife would tell him he would be staring off before convulsing. He also has nocturnal convulsions. He used to had olfactory hallucinations and see his fingers start to shiver a little when younger. When he has his warning symptoms, he knows he has to lie down. He has bitten his tongue with the seizures. No focal weakness, both legs feel weaker after. He has been married to his wife for 3 years and she has witnessed mostly nocturnal seizures, he had a seizure in June 2020 reportedly when he was given prednisone for a work-related injury. Last nocturnal seizure was a month ago, he recalls feeling more tired that day. Last seizure in wakefulness in October 2020. He denies any isolated staring spells. Sometimes when he forgets his evening medication, he can feel strange but it does not progress. If he gets overheated from working too much, it gets to a point where he has to rest and has "just a little shake" and goes into a deep sweat. The deep sweats started later on. He recalls being admitted to the EMU at [facility] and [facility] in [place] in 2005 for 5 days where they "made me have seizures and told there is nothing wrong with you." He was  discharged home on the same medications, he has been on Tegretol 200mg  2 tabs BID since age 36, then after his EMU stay was started on Lamotrigine 200mg  BID. No side effects on medications.   He denies any headaches, dizziness, diplopia, dysarthria/dysphagia,bowel/bladder dysfunction. 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 He continues to have pain in his left chest, back around his shoulder blade, with numbness in his fingers. He now does light duty washing dishes. Sleep is off and on, no daytime drowsiness. Mood is fine. He has memory issues, it is hard for him to remember. He does not drink alcohol, in the past he had noticed it would be a seizure trigger.   Epilepsy Risk Factors:  He had a normal birth and early development.  There is no history of febrile convulsions, CNS infections such as meningitis/encephalitis, significant traumatic brain injury, neurosurgical procedures, or family history of seizures.  Diagnostic Data: Head CT without contrast done for seizure in 01/2009 was degraded by motion, there were no acute changes seen, there was note of metallic foreign body anterior to the right maxillary sinus.  No prior EEG available for review.   PAST MEDICAL HISTORY: Past Medical History:  Diagnosis Date  . Seizures ([place])     PAST SURGICAL HISTORY: History reviewed. No pertinent surgical history.  MEDICATIONS: Current Outpatient Medications on File Prior to Visit  Medication Sig Dispense Refill  .  carbamazepine (TEGRETOL) 200 MG tablet Take 3 tablets twice daily. 540 tablet 0  . lamoTRIgine (LAMICTAL) 200 MG tablet TAKE ONE TABLET BY MOUTH TWICE A DAY 180 tablet 0   No current facility-administered medications on file prior to visit.    ALLERGIES: No Known Allergies  FAMILY HISTORY: Family History  Problem Relation Age of Onset  . Diabetes Mother   . Prostate cancer Father   . Alzheimer's disease  Father     SOCIAL HISTORY: Social History   Socioeconomic History  . Marital status: Married    Spouse name: Not on file  . Number of children: Not on file  . Years of education: Not on file  . Highest education level: Not on file  Occupational History  . Not on file  Tobacco Use  . Smoking status: Former Smoker    Years: 17.00    Types: Cigarettes  . Smokeless tobacco: Never Used  Substance and Sexual Activity  . Alcohol use: Not Currently  . Drug use: Not Currently    Comment: Hx of marijuana  . Sexual activity: Yes  Other Topics Concern  . Not on file  Social History Narrative   Right handed   Married   1 Son   Social Determinants of Health   Financial Resource Strain:   . Difficulty of Paying Living Expenses: Not on file  Food Insecurity:   . Worried About Programme researcher, broadcasting/film/video in the Last Year: Not on file  . Ran Out of Food in the Last Year: Not on file  Transportation Needs:   . Lack of Transportation (Medical): Not on file  . Lack of Transportation (Non-Medical): Not on file  Physical Activity:   . Days of Exercise per Week: Not on file  . Minutes of Exercise per Session: Not on file  Stress:   . Feeling of Stress : Not on file  Social Connections:   . Frequency of Communication with Friends and Family: Not on file  . Frequency of Social Gatherings with Friends and Family: Not on file  . Attends Religious Services: Not on file  . Active Member of Clubs or Organizations: Not on file  . Attends Banker Meetings: Not on file  . Marital Status: Not on file  Intimate Partner Violence:   . Fear of Current or Ex-Partner: Not on file  . Emotionally Abused: Not on file  . Physically Abused: Not on file  . Sexually Abused: Not on file    REVIEW OF SYSTEMS: Constitutional: No fevers, chills, or sweats, no generalized fatigue, change in appetite Eyes: No visual changes, double vision, eye pain Ear, nose and throat: No hearing loss, ear pain, nasal  congestion, sore throat Cardiovascular: No chest pain, palpitations Respiratory:  No shortness of breath at rest or with exertion, wheezes GastrointestinaI: No nausea, vomiting, diarrhea, abdominal pain, fecal incontinence Genitourinary:  No dysuria, urinary retention or frequency Musculoskeletal: + neck pain, back pain Integumentary: No rash, pruritus, skin lesions Neurological: as above Psychiatric: No depression, insomnia, anxiety Endocrine: No palpitations, fatigue, diaphoresis, mood swings, change in appetite, change in weight, increased thirst Hematologic/Lymphatic:  No anemia, purpura, petechiae. Allergic/Immunologic: no itchy/runny eyes, nasal congestion, recent allergic reactions, rashes  PHYSICAL EXAM: Vitals:   01/29/20 0914  BP: 122/78  Pulse: 75  SpO2: 97%   General: No acute distress Head:  Normocephalic/atraumatic Skin/Extremities: No rash, no edema Neurological Exam: Mental status: alert and oriented to person, place, and time, no dysarthria or aphasia, Fund of knowledge  is appropriate.  Recent and remote memory are intact.  Attention and concentration are normal.   Cranial nerves: CN I: not tested CN II: pupils equal, round and reactive to light, visual fields intact CN III, IV, VI:  full range of motion, no nystagmus, no ptosis CN V: facial sensation intact CN VII: upper and lower face symmetric CN VIII: hearing intact to conversation Bulk & Tone: normal, no fasciculations. Motor: 5/5 throughout with no pronator drift. Sensation: intact to light touch, cold, pin, vibration and joint position sense.  No extinction to double simultaneous stimulation.  Romberg test negative Deep Tendon Reflexes: +2 throughout, no ankle clonus Plantar responses: downgoing bilaterally Cerebellar: no incoordination on finger to nose testing Gait: narrow-based and steady, able to tandem walk adequately. Tremor: none  IMPRESSION: This is a 44 year old right-handed man with a  history of seizures childhood presenting to establish care. 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 Refills for Tegretol 200mg  2 tabs BID and Lamictal 200mg  BID were sent. [place] driving laws were discussed with the patient, and he knows to stop driving after a seizure, until 6 months seizure-free. Follow-up in 6 months, he knows to call for any changes.   Thank you for allowing me to participate in the care of this patient. Please do not hesitate to call for any questions or concerns.   , M.D.  CC: Dr. 

## 2020-07-30 ENCOUNTER — Ambulatory Visit: Payer: No Typology Code available for payment source | Admitting: Neurology

## 2020-08-10 ENCOUNTER — Ambulatory Visit: Payer: No Typology Code available for payment source | Admitting: Neurology

## 2020-08-23 ENCOUNTER — Emergency Department (HOSPITAL_COMMUNITY)
Admission: EM | Admit: 2020-08-23 | Discharge: 2020-08-23 | Disposition: A | Discharge status: Home / Self Care | Payer: No Typology Code available for payment source | Attending: Emergency Medicine | Admitting: Emergency Medicine

## 2020-08-23 ENCOUNTER — Encounter (HOSPITAL_COMMUNITY): Payer: Self-pay | Admitting: Obstetrics and Gynecology

## 2020-08-23 ENCOUNTER — Other Ambulatory Visit: Payer: Self-pay

## 2020-08-23 DIAGNOSIS — Z5321 Procedure and treatment not carried out due to patient leaving prior to being seen by health care provider: Secondary | ICD-10-CM | POA: Diagnosis not present

## 2020-08-23 DIAGNOSIS — R569 Unspecified convulsions: Secondary | ICD-10-CM | POA: Diagnosis present

## 2020-08-23 LAB — CBG MONITORING, ED: Glucose-Capillary: 127 mg/dL — ABNORMAL HIGH (ref 70–99)

## 2020-08-23 NOTE — ED Triage Notes (Signed)
Patient reports to the ER for possible seizure by EMS. Patient was found laying in the bushes and was alert and oriented x1 with EMS. Patient denies pain. Patient denies alcohol or drug use

## 2020-08-23 NOTE — ED Notes (Signed)
Patient stated he was leaving wanted a cup of water because he had to walk so I gave him one

## 2020-09-03 ENCOUNTER — Other Ambulatory Visit: Payer: Self-pay | Admitting: Family Medicine

## 2020-09-03 DIAGNOSIS — G40009 Localization-related (focal) (partial) idiopathic epilepsy and epileptic syndromes with seizures of localized onset, not intractable, without status epilepticus: Secondary | ICD-10-CM

## 2021-02-08 ENCOUNTER — Other Ambulatory Visit: Payer: Self-pay

## 2021-02-08 ENCOUNTER — Telehealth: Payer: Self-pay | Admitting: Neurology

## 2021-02-08 DIAGNOSIS — G40009 Localization-related (focal) (partial) idiopathic epilepsy and epileptic syndromes with seizures of localized onset, not intractable, without status epilepticus: Secondary | ICD-10-CM

## 2021-02-08 NOTE — Telephone Encounter (Signed)
Denied sent refusal , patient ntbs.

## 2021-02-08 NOTE — Telephone Encounter (Signed)
I called the patient and left a message to call back to get a follow up visit scheduled.

## 2021-04-20 IMAGING — DX DG CHEST 1V PORT
1 series · 1 of 1 positions shown · non-contrast
Comparison: 01/29/2009

CLINICAL DATA: Cough and short of breath, COVID positive

EXAM:
PORTABLE CHEST 1 VIEW

[chest ap]
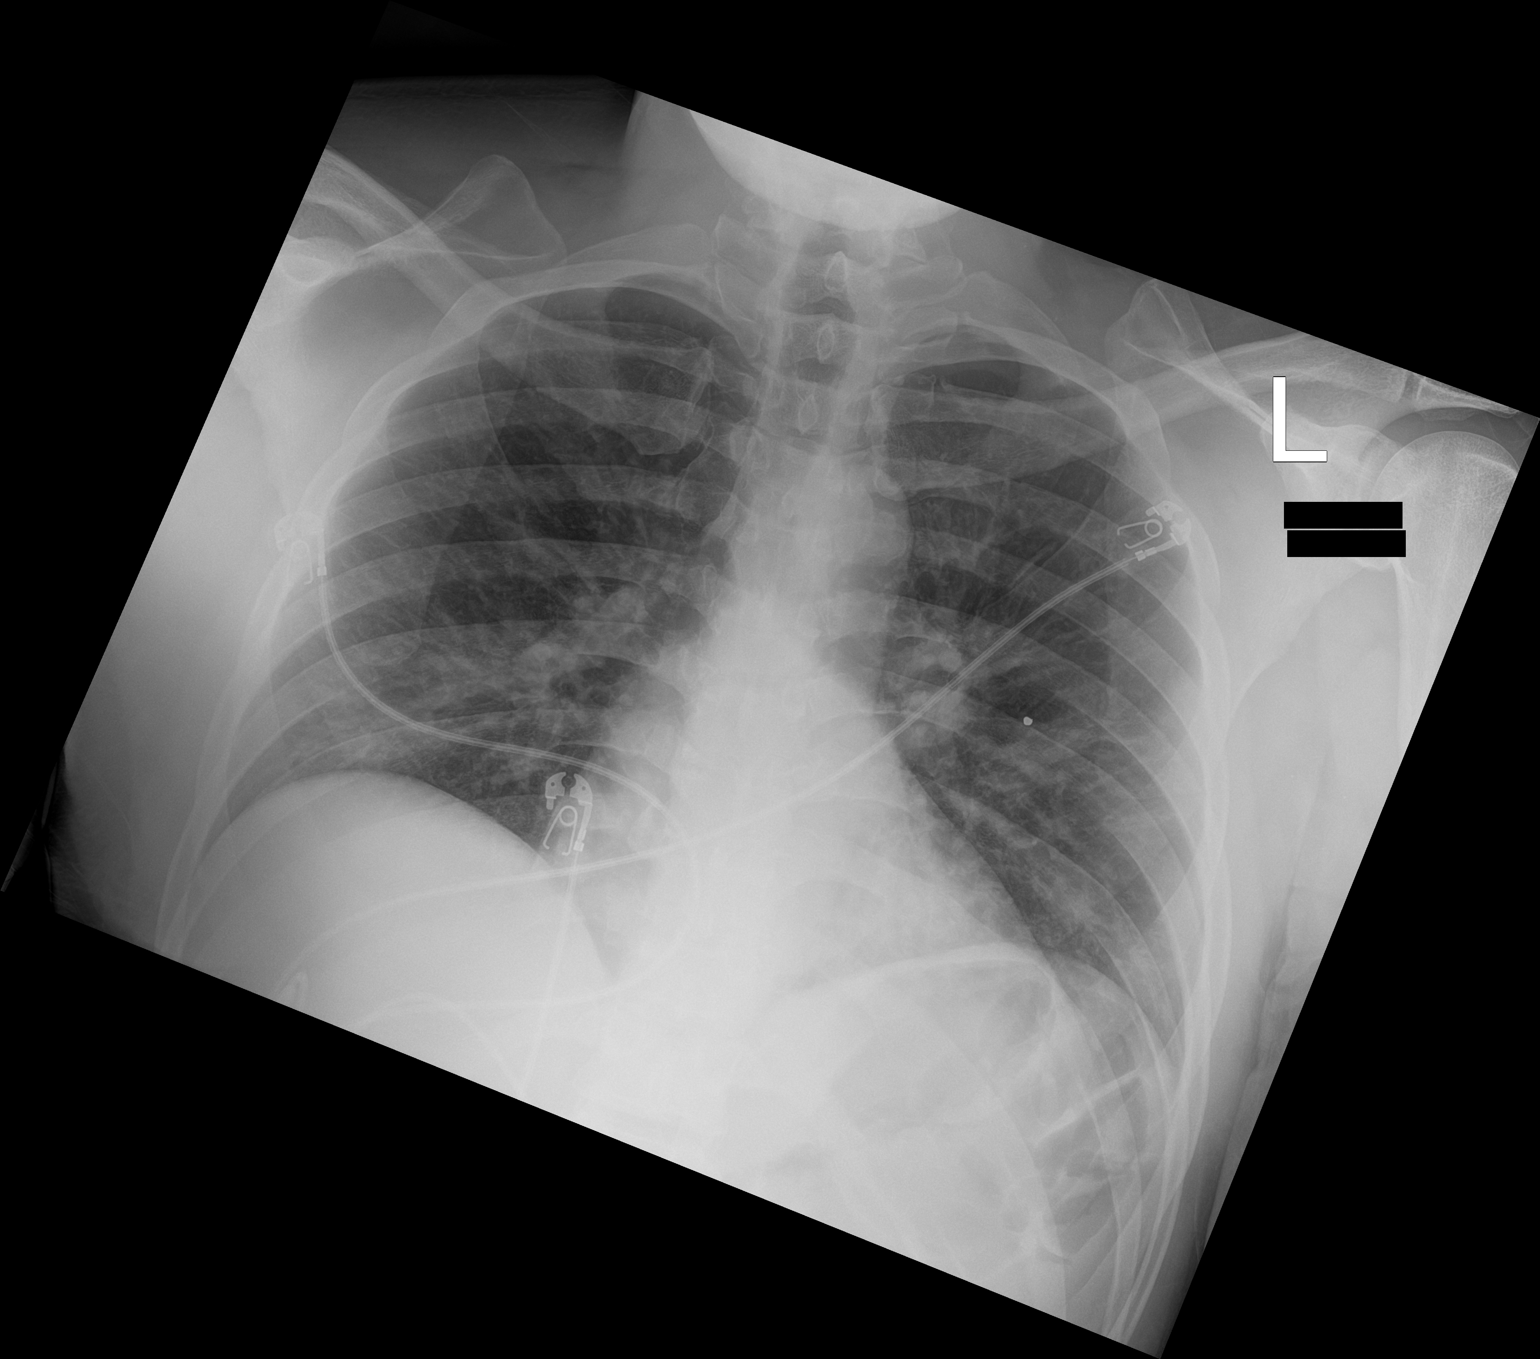

[1 of 1 positions shown; findings below may reference images not displayed]

FINDINGS: Streaky perihilar opacity with patchy airspace disease at the bases.
Normal heart size. No pneumothorax. Possible metallic foreign body
over the left mid chest.
IMPRESSION: Streaky perihilar opacity with patchy airspace disease the bases,
consistent with pneumonia, and given clinical history.
